# Patient Record
Sex: Female | Born: 1995 | Race: White | Hispanic: No | Marital: Single | Smoking: Never smoker
Health system: Southern US, Community
[De-identification: ages and names within clinical notes are randomized; demographics above are authoritative.]

## PROBLEM LIST (undated history)

## (undated) DIAGNOSIS — Z789 Other specified health status: Secondary | ICD-10-CM

## (undated) HISTORY — PX: MYRINGOTOMY WITH TUBE PLACEMENT: SHX5663

---
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
## 2017-01-12 ENCOUNTER — Ambulatory Visit: Payer: BLUE CROSS/BLUE SHIELD | Admitting: Family Medicine

## 2017-01-12 ENCOUNTER — Ambulatory Visit (INDEPENDENT_AMBULATORY_CARE_PROVIDER_SITE_OTHER): Payer: BLUE CROSS/BLUE SHIELD | Admitting: Family Medicine

## 2017-01-12 ENCOUNTER — Encounter: Payer: Self-pay | Admitting: Family Medicine

## 2017-01-12 VITALS — BP 124/62 | HR 85 | Temp 98.4°F | Ht 61.5 in | Wt 118.0 lb

## 2017-01-12 DIAGNOSIS — J0100 Acute maxillary sinusitis, unspecified: Secondary | ICD-10-CM

## 2017-01-12 DIAGNOSIS — N3001 Acute cystitis with hematuria: Secondary | ICD-10-CM

## 2017-01-12 DIAGNOSIS — R3915 Urgency of urination: Secondary | ICD-10-CM

## 2017-01-12 DIAGNOSIS — R591 Generalized enlarged lymph nodes: Secondary | ICD-10-CM

## 2017-01-12 DIAGNOSIS — R599 Enlarged lymph nodes, unspecified: Secondary | ICD-10-CM | POA: Insufficient documentation

## 2017-01-12 DIAGNOSIS — N390 Urinary tract infection, site not specified: Secondary | ICD-10-CM | POA: Insufficient documentation

## 2017-01-12 DIAGNOSIS — J019 Acute sinusitis, unspecified: Secondary | ICD-10-CM | POA: Insufficient documentation

## 2017-01-12 LAB — POC URINALSYSI DIPSTICK (AUTOMATED)
Blood, UA: 80
Spec Grav, UA: >=1.03 — AB (ref 1.010–1.025)
pH, UA: 5.5 (ref 5.0–8.0)

## 2017-01-12 MED ORDER — CEPHALEXIN 500 MG PO CAPS: 500.0000 mg | ORAL_CAPSULE | Freq: Three times a day (TID) | ORAL | 0 refills | Status: DC

## 2017-01-12 MED ORDER — BENZONATATE 200 MG PO CAPS: 200.0000 mg | ORAL_CAPSULE | Freq: Three times a day (TID) | ORAL | 1 refills | Status: DC | PRN

## 2017-01-12 NOTE — Progress Notes (Signed)
Subjective:    Patient ID: Marcia Thomas, female    DOB: 1996/03/19, 21 y.o.   MRN: [id number]  HPI Here for uri and urinary c/o   Just moved back from college-exp to lots of dust  Sick with uri symptoms for over 3 weeks Also had strep throat 2 wk ago  Her lymph nodes still feel swollen Allergies (pollen  Kicked in)  Can't stop coughing -did not sleep last night   Cough- is occ prod- clear/ ? Unsure if color  A lot of nasal d/c - that was green and lighter now  No fever  Facial pain - maxillary sinus area  otc: sudafed/ zyrtec/ occ mucinex  Ibuprofen sometimes   Urinary symptoms:  Last night she felt like she had to urinate constantly  (of note- had yeast infection) Saw some blood in urine  She took some uristat  Not on menses  LMP - April 15th -- she is sexually active and uses condoms every time   Some burning to urinate  No back pain  Has had a uti before-similar symptoms    Results for orders placed or performed in visit on 01/12/17  POCT Urinalysis Dipstick (Automated)  Result Value Ref Range   Color, UA Orange    Clarity, UA Hazy    Glucose, UA     Bilirubin, UA     Ketones, UA     Spec Grav, UA >=1.030 (A) 1.010 - 1.025   Blood, UA 80 Ery/uL    pH, UA 5.5 5.0 - 8.0   Protein, UA     Urobilinogen, UA  0.2 or 1.0 E.U./dL   Nitrite, UA     Leukocytes, UA Large (3+) (A) Negative   of note- she has uristat in her system as well   Patient Active Problem List   Diagnosis Date Noted  . Acute sinusitis 01/12/2017  . UTI (urinary tract infection) 01/12/2017  . Adenopathy 01/12/2017  . Sports physical 04/13/2015  . Well child check 04/13/2015   No past medical history on file. No past surgical history on file. Social History  Substance Use Topics  . Smoking status: Never Smoker  . Smokeless tobacco: Never Used  . Alcohol use Yes     Comment: occ/socially   No family history on file. No Known Allergies No current outpatient prescriptions on file  prior to visit.   No current facility-administered medications on file prior to visit.      Review of Systems  Constitutional: Positive for appetite change and fatigue. Negative for activity change and fever.  HENT: Positive for congestion, postnasal drip, rhinorrhea, sinus pressure, sneezing and sore throat. Negative for ear pain.   Eyes: Negative for pain, discharge, itching and visual disturbance.  Respiratory: Positive for cough. Negative for shortness of breath, wheezing and stridor.   Cardiovascular: Negative for chest pain and leg swelling.  Gastrointestinal: Negative for abdominal distention, abdominal pain, constipation, diarrhea, nausea and vomiting.  Endocrine: Negative for cold intolerance and polydipsia.  Genitourinary: Positive for dysuria and frequency. Negative for difficulty urinating, flank pain, hematuria, urgency and vaginal discharge.  Musculoskeletal: Negative for arthralgias and myalgias.  Skin: Negative for rash.  Allergic/Immunologic: Negative for immunocompromised state.  Neurological: Positive for headaches. Negative for dizziness, weakness and light-headedness.  Hematological: Negative for adenopathy.  Psychiatric/Behavioral: Negative for confusion and dysphoric mood.       Objective:   Physical Exam  Constitutional: She appears well-developed and well-nourished. No distress.  HENT:  Head: Normocephalic and  atraumatic.  Right Ear: External ear normal.  Left Ear: External ear normal.  Mouth/Throat: Oropharynx is clear and moist.  Nares are injected and congested  bilat maxillary and frontal sinus tenderness  Clear rhinorrhea and post nasal drip   Eyes: Conjunctivae and EOM are normal. Pupils are equal, round, and reactive to light. Right eye exhibits no discharge. Left eye exhibits no discharge.  Neck: Normal range of motion. Neck supple.  L submandibular node is palpable and tender moblie- 1 cm approx  No other adenopathy noted   Cardiovascular:  Normal rate, regular rhythm and normal heart sounds.   Pulmonary/Chest: Effort normal and breath sounds normal. No respiratory distress. She has no wheezes. She has no rales. She exhibits no tenderness.  Abdominal: Soft. Bowel sounds are normal. She exhibits no distension. There is tenderness. There is no rebound.  No cva tenderness  Mild suprapubic tenderness  Musculoskeletal: She exhibits no edema.  Lymphadenopathy:    She has no cervical adenopathy.  Neurological: She is alert.  Skin: Skin is warm and dry. No rash noted.  Psychiatric: She has a normal mood and affect.          Assessment & Plan:   Problem List Items Addressed This Visit      Respiratory   Acute sinusitis    s/p allergies and uri Cover with keflex (also for uti ) Disc symptomatic care - see instructions on AVS  Update if not starting to improve in a week or if worsening    Tessalon given for cough mucinex dm prn also        Relevant Medications   cephALEXin (KEFLEX) 500 MG capsule   benzonatate (TESSALON) 200 MG capsule     Immune and Lymphatic   Adenopathy    L submandibular node is tender This may be due to several upper resp infx in a row but warrants a re check  We will schedule with pcp 2 wk        Genitourinary   UTI (urinary tract infection)    Cover with keflex Literature given -re: prevention Fluids ucx sent  Update if no imp in several days or if worse       Relevant Medications   cephALEXin (KEFLEX) 500 MG capsule   Other Relevant Orders   Urine culture    Other Visit Diagnoses    Urinary urgency    -  Primary   Relevant Orders   POCT Urinalysis Dipstick (Automated) (Completed)

## 2017-01-12 NOTE — Progress Notes (Signed)
Pre visit review using our clinic review tool, if applicable. No additional management support is needed unless otherwise documented below in the visit note. 

## 2017-01-12 NOTE — Patient Instructions (Addendum)
Drink lots of water Get extra rest if you can  Always wipe front to back to prevent utis  Take the keflex as directed for both sinus infection and uti  We will let you know when urine culture results return   mucinex is ok  Tessalon for cough as well   Update if not starting to improve in a week or if worsening   If uti symptom worsen let Marcia Thomas know right away   Follow up with Dr Dayton Martes in about 2 weeks to check your lymph node and also talk about oral contraceptive       Urinary Tract Infection, Adult A urinary tract infection (UTI) is an infection of any part of the urinary tract, which includes the kidneys, ureters, bladder, and urethra. These organs make, store, and get rid of urine in the body. UTI can be a bladder infection (cystitis) or kidney infection (pyelonephritis). What are the causes? This infection may be caused by fungi, viruses, or bacteria. Bacteria are the most common cause of UTIs. This condition can also be caused by repeated incomplete emptying of the bladder during urination. What increases the risk? This condition is more likely to develop if:  You ignore your need to urinate or hold urine for long periods of time.  You do not empty your bladder completely during urination.  You wipe back to front after urinating or having a bowel movement, if you are female.  You are uncircumcised, if you are female.  You are constipated.  You have a urinary catheter that stays in place (indwelling).  You have a weak defense (immune) system.  You have a medical condition that affects your bowels, kidneys, or bladder.  You have diabetes.  You take antibiotic medicines frequently or for long periods of time, and the antibiotics no longer work well against certain types of infections (antibiotic resistance).  You take medicines that irritate your urinary tract.  You are exposed to chemicals that irritate your urinary tract.  You are female. What are the signs or  symptoms? Symptoms of this condition include:  Fever.  Frequent urination or passing small amounts of urine frequently.  Needing to urinate urgently.  Pain or burning with urination.  Urine that smells bad or unusual.  Cloudy urine.  Pain in the lower abdomen or back.  Trouble urinating.  Blood in the urine.  Vomiting or being less hungry than normal.  Diarrhea or abdominal pain.  Vaginal discharge, if you are female. How is this diagnosed? This condition is diagnosed with a medical history and physical exam. You will also need to provide a urine sample to test your urine. Other tests may be done, including:  Blood tests.  Sexually transmitted disease (STD) testing. If you have had more than one UTI, a cystoscopy or imaging studies may be done to determine the cause of the infections. How is this treated? Treatment for this condition often includes a combination of two or more of the following:  Antibiotic medicine.  Other medicines to treat less common causes of UTI.  Over-the-counter medicines to treat pain.  Drinking enough water to stay hydrated. Follow these instructions at home:  Take over-the-counter and prescription medicines only as told by your health care provider.  If you were prescribed an antibiotic, take it as told by your health care provider. Do not stop taking the antibiotic even if you start to feel better.  Avoid alcohol, caffeine, tea, and carbonated beverages. They can irritate your bladder.  Drink  enough fluid to keep your urine clear or pale yellow.  Keep all follow-up visits as told by your health care provider. This is important.  Make sure to:  Empty your bladder often and completely. Do not hold urine for long periods of time.  Empty your bladder before and after sex.  Wipe from front to back after a bowel movement if you are female. Use each tissue one time when you wipe. Contact a health care provider if:  You have back  pain.  You have a fever.  You feel nauseous or vomit.  Your symptoms do not get better after 3 days.  Your symptoms go away and then return. Get help right away if:  You have severe back pain or lower abdominal pain.  You are vomiting and cannot keep down any medicines or water. This information is not intended to replace advice given to you by your health care provider. Make sure you discuss any questions you have with your health care provider. Document Released: 06/07/2005 Document Revised: 02/09/2016 Document Reviewed: 07/19/2015 Elsevier Interactive Patient Education  2017 [employer]

## 2017-01-12 NOTE — Assessment & Plan Note (Signed)
L submandibular node is tender This may be due to several upper resp infx in a row but warrants a re check  We will schedule with pcp 2 wk

## 2017-01-12 NOTE — Assessment & Plan Note (Signed)
Cover with keflex Literature given -re: prevention Fluids ucx sent  Update if no imp in several days or if worse

## 2017-01-12 NOTE — Assessment & Plan Note (Signed)
s/p allergies and uri Cover with keflex (also for uti ) Disc symptomatic care - see instructions on AVS  Update if not starting to improve in a week or if worsening    Tessalon given for cough mucinex dm prn also

## 2017-01-15 LAB — URINE CULTURE

## 2017-01-29 ENCOUNTER — Encounter: Payer: Self-pay | Admitting: Family Medicine

## 2017-01-29 ENCOUNTER — Ambulatory Visit (INDEPENDENT_AMBULATORY_CARE_PROVIDER_SITE_OTHER): Payer: BLUE CROSS/BLUE SHIELD | Admitting: Family Medicine

## 2017-01-29 VITALS — BP 100/62 | HR 76 | Temp 98.0°F | Ht 61.5 in | Wt 118.0 lb

## 2017-01-29 DIAGNOSIS — R591 Generalized enlarged lymph nodes: Secondary | ICD-10-CM

## 2017-01-29 DIAGNOSIS — J0100 Acute maxillary sinusitis, unspecified: Secondary | ICD-10-CM

## 2017-01-29 DIAGNOSIS — N3001 Acute cystitis with hematuria: Secondary | ICD-10-CM

## 2017-01-29 DIAGNOSIS — R599 Enlarged lymph nodes, unspecified: Secondary | ICD-10-CM

## 2017-01-29 LAB — CBC WITH DIFFERENTIAL/PLATELET
Basophils Absolute: 0 10*3/uL (ref 0.0–0.1)
Basophils Relative: 0.3 % (ref 0.0–3.0)
Eosinophils Absolute: 0.2 10*3/uL (ref 0.0–0.7)
Eosinophils Relative: 2.2 % (ref 0.0–5.0)
HCT: 41 % (ref 36.0–49.0)
Hemoglobin: 13.6 g/dL (ref 12.0–16.0)
Lymphocytes Relative: 30.2 % (ref 24.0–48.0)
Lymphs Abs: 2.8 10*3/uL (ref 0.7–4.0)
MCHC: 33.2 g/dL (ref 31.0–37.0)
MCV: 87.5 fl (ref 78.0–98.0)
Monocytes Absolute: 1 10*3/uL (ref 0.1–1.0)
Monocytes Relative: 10.4 % (ref 3.0–12.0)
Neutro Abs: 5.3 10*3/uL (ref 1.4–7.7)
Neutrophils Relative %: 56.9 % (ref 43.0–71.0)
Platelets: 406 10*3/uL (ref 150.0–575.0)
RBC: 4.69 Mil/uL (ref 3.80–5.70)
RDW: 13.2 % (ref 11.4–15.5)
WBC: 9.3 10*3/uL (ref 4.5–13.5)

## 2017-01-29 NOTE — Assessment & Plan Note (Signed)
Persistent.  Likely remains reactive given multiple upper resp/throat infections recently but will check CBC and US for further evaluation today. The patient indicates understanding of these issues and agrees with the plan.

## 2017-01-29 NOTE — Assessment & Plan Note (Signed)
Symptoms resolving s/p Keflex. Call or return to clinic prn if these symptoms worsen or fail to improve as anticipated.

## 2017-01-29 NOTE — Progress Notes (Signed)
Subjective:   Patient ID: Marcia Thomas, female    DOB: 04/17/1996, 21 y.o.   MRN: [id number]  Marcia Thomas is a pleasant 21 y.o. year old female who presents to clinic today with Follow-up  on 01/29/2017  HPI:  Saw my partner, Dr. Milinda Antisower on 01/12/17 for several issues. Note reviewed.  She did advise her to follow up with me here today.  Initial complaint was URI symptoms for over 3 weeks with a productive cough and facial pain. Was also having urinary symptoms of increased urinary frequency, dysuria and hematuria. UA pos for LE and RBCs Some cervical lymphadenopathy was noted  Dr. Milinda Antisower placed her on Keflex to cover upper respiratory and urinary pathogens.  Also given tessalon for cough.  Urine cx grew e coli- pan sensitive.  Advised to see me for follow up left enlarged and tender left cervical lymph node.  Marcia Thomas reports that it is still swollen. Non tender.  UTI symptoms have resolved completely and URI symptoms are resolving.  She still has a slight cough.  No current outpatient prescriptions on file prior to visit.   No current facility-administered medications on file prior to visit.     No Known Allergies  No past medical history on file.  No past surgical history on file.  No family history on file.  Social History   Social History  . Marital status: Single    Spouse name: N/A  . Number of children: N/A  . Years of education: N/A   Occupational History  . Not on file.   Social History Main Topics  . Smoking status: Never Smoker  . Smokeless tobacco: Never Used  . Alcohol use Yes     Comment: occ/socially  . Drug use: No  . Sexual activity: Not on file   Other Topics Concern  . Not on file   Social History Narrative   Going into senior year.   Wants to go to [employer]- wants to be a dermatologist.      Doing well in school.   Tennis and softball.   The PMH, PSH, Social History, Family History, Medications, and allergies [facility]  [facility], and have been updated if relevant.   Review of Systems  Constitutional: Negative.   HENT: Negative.   Respiratory: Negative.   Cardiovascular: Negative.   Gastrointestinal: Negative.   Genitourinary: Negative.   Musculoskeletal: Negative.   Allergic/Immunologic: Negative.   Neurological: Negative.   Hematological: Positive for adenopathy.  Psychiatric/Behavioral: Negative.   All other systems reviewed and are negative.      Objective:    BP 100/62   Pulse 76   Temp 98 F (36.7 C)   Ht 5' 1.5" (1.562 m)   Wt 118 lb (53.5 kg)   SpO2 98%   BMI 21.93 kg/m    Physical Exam  Constitutional: She is oriented to person, place, and time. She appears well-developed and well-nourished. No distress.  HENT:  Head: Normocephalic and atraumatic.  Eyes: Conjunctivae are normal.  Cardiovascular: Normal rate and regular rhythm.   Pulmonary/Chest: Effort normal and breath sounds normal.  Musculoskeletal: Normal range of motion.  Lymphadenopathy:       Head (left side): No submandibular adenopathy present.    She has cervical adenopathy.  Neurological: She is alert and oriented to person, place, and time. No cranial nerve deficit.  Skin: Skin is warm and dry. She is not diaphoretic.  Psychiatric: She has a normal mood and affect. Her behavior  is normal. Judgment and thought content normal.  Nursing note and vitals reviewed.         Assessment & Plan:   Acute cystitis with hematuria  Acute maxillary sinusitis, recurrence not specified  Adenopathy No Follow-up on file.

## 2017-01-29 NOTE — Patient Instructions (Signed)
Great to see you.  I will call you with your lab results.  Please stop by to see Marion on your way out. 

## 2017-01-29 NOTE — Assessment & Plan Note (Signed)
Urine cx grew pansensitive e coli which should have been sensitive to kelfex. Symptoms have resolved.  No further work up or tx warranted at this time.

## 2017-01-31 ENCOUNTER — Ambulatory Visit
Admission: RE | Admit: 2017-01-31 | Discharge: 2017-01-31 | Disposition: A | Discharge status: Home / Self Care | Payer: BLUE CROSS/BLUE SHIELD | Source: Ambulatory Visit | Attending: Family Medicine | Admitting: Family Medicine

## 2017-01-31 DIAGNOSIS — R591 Generalized enlarged lymph nodes: Secondary | ICD-10-CM | POA: Insufficient documentation

## 2017-01-31 DIAGNOSIS — R599 Enlarged lymph nodes, unspecified: Secondary | ICD-10-CM

## 2017-02-02 ENCOUNTER — Other Ambulatory Visit: Payer: Self-pay | Admitting: Family Medicine

## 2017-02-02 DIAGNOSIS — R590 Localized enlarged lymph nodes: Secondary | ICD-10-CM

## 2017-03-07 ENCOUNTER — Ambulatory Visit (INDEPENDENT_AMBULATORY_CARE_PROVIDER_SITE_OTHER): Payer: BLUE CROSS/BLUE SHIELD | Admitting: Family Medicine

## 2017-03-07 ENCOUNTER — Encounter: Payer: Self-pay | Admitting: Family Medicine

## 2017-03-07 VITALS — BP 100/62 | HR 75 | Temp 98.1°F | Wt 115.0 lb

## 2017-03-07 DIAGNOSIS — R319 Hematuria, unspecified: Secondary | ICD-10-CM | POA: Diagnosis not present

## 2017-03-07 DIAGNOSIS — R300 Dysuria: Secondary | ICD-10-CM

## 2017-03-07 LAB — POC URINALSYSI DIPSTICK (AUTOMATED)
Bilirubin, UA: NEGATIVE
Blood, UA: NEGATIVE
Glucose, UA: NEGATIVE
Ketones, UA: NEGATIVE
Leukocytes, UA: NEGATIVE
Nitrite, UA: NEGATIVE
Protein, UA: NEGATIVE
Spec Grav, UA: >=1.03 — AB (ref 1.010–1.025)
Urobilinogen, UA: 0.2 E.U./dL
pH, UA: 6 (ref 5.0–8.0)

## 2017-03-07 NOTE — Progress Notes (Signed)
Pre visit review using our clinic review tool, if applicable. No additional management support is needed unless otherwise documented below in the visit note. 

## 2017-03-07 NOTE — Progress Notes (Signed)
SUBJECTIVE: Marcia Thomas is a 21 y.o. female who complains of urinary frequency, urgency, hematuria and dysuria x 2 days, without flank pain, fever, chills, or abnormal vaginal discharge.  Was treated for UTI with keflex by Dr. Milinda Antisower last month.  Urine cx reviewed from 01/12/17- >100,000 E coli, pan sensitive.  Symptoms resolved after she took kelfex.  Restarted again 2 days.  No current outpatient prescriptions on file prior to visit.   No current facility-administered medications on file prior to visit.     No Known Allergies  No past medical history on file.  No past surgical history on file.  No family history on file.  Social History   Social History  . Marital status: Single    Spouse name: N/A  . Number of children: N/A  . Years of education: N/A   Occupational History  . Not on file.   Social History Main Topics  . Smoking status: Never Smoker  . Smokeless tobacco: Never Used  . Alcohol use Yes     Comment: occ/socially  . Drug use: No  . Sexual activity: Not on file   Other Topics Concern  . Not on file   Social History Narrative   Going into senior year.   Wants to go to [employer]- wants to be a dermatologist.      Doing well in school.   Tennis and softball.   The PMH, PSH, Social History, Family History, Medications, and allergies have [facility], and have been updated if relevant.  OBJECTIVE:  BP 100/62   Pulse 75   Temp 98.1 F (36.7 C)   Wt 115 lb (52.2 kg)   SpO2 97%   BMI 21.38 kg/m   Appears well, in no apparent distress.  Vital signs are normal. The abdomen is soft without tenderness, guarding, mass, rebound or organomegaly. No CVA tenderness or inguinal adenopathy noted. Urine dipstick shows negative for all components.  Micro exam: not done.   ASSESSMENT: UA neg- UTI resolved and symptoms improving.    PLAN: Reassurance provided. Push fluids. Call or return to clinic prn if these symptoms worsen or fail to improve as  anticipated. The patient indicates understanding of these issues and agrees with the plan.

## 2017-03-11 ENCOUNTER — Ambulatory Visit
Admission: EM | Admit: 2017-03-11 | Discharge: 2017-03-11 | Disposition: A | Discharge status: Home / Self Care | Payer: BLUE CROSS/BLUE SHIELD | Attending: Family Medicine | Admitting: Family Medicine

## 2017-03-11 DIAGNOSIS — R509 Fever, unspecified: Secondary | ICD-10-CM

## 2017-03-11 DIAGNOSIS — J020 Streptococcal pharyngitis: Secondary | ICD-10-CM

## 2017-03-11 LAB — RAPID STREP SCREEN (MED CTR MEBANE ONLY): Streptococcus, Group A Screen (Direct): POSITIVE — AB

## 2017-03-11 MED ORDER — IBUPROFEN 400 MG PO TABS: 400.0000 mg | ORAL_TABLET | Freq: Once | ORAL | Status: DC

## 2017-03-11 MED ORDER — AMOXICILLIN 875 MG PO TABS: 875.0000 mg | ORAL_TABLET | Freq: Two times a day (BID) | ORAL | 0 refills | Status: AC

## 2017-03-11 MED ORDER — ACETAMINOPHEN 500 MG PO TABS
1000.0000 mg | ORAL_TABLET | Freq: Once | ORAL | Status: AC
  Administered 2017-03-11: 1000 mg via ORAL

## 2017-03-11 MED ORDER — IBUPROFEN 400 MG PO TABS
400.0000 mg | ORAL_TABLET | Freq: Once | ORAL | Status: AC
  Administered 2017-03-11: 400 mg via ORAL

## 2017-03-11 NOTE — ED Provider Notes (Signed)
CSN: [id number]     Arrival date & time 03/11/17  1520 History   None    Chief Complaint  Patient presents with  . Sore Throat   (Consider location/radiation/quality/duration/timing/severity/associated sxs/prior Treatment) HPI  21 year old female presents to the clinic for evaluation of sore throat and fever. Symptoms been present for 24 hours. She is tolerating by mouth well. She took 400 mg of ibuprofen 3 hours ago. She's not had any medications. Tolerating fluids currently. She denies any cough congestion or runny nose. She is having mild headache with body aches. No chest pain or shortness of breath. No skin rashes.  History reviewed. No pertinent past medical history. History reviewed. No pertinent surgical history. History reviewed. No pertinent family history. Social History  Substance Use Topics  . Smoking status: Never Smoker  . Smokeless tobacco: Never Used  . Alcohol use Yes     Comment: occ/socially   OB History    No data available     Review of Systems  Constitutional: Positive for fever.  HENT: Positive for sore throat. Negative for trouble swallowing and voice change.   Respiratory: Negative for shortness of breath.   Cardiovascular: Negative for chest pain.  Skin: Positive for rash.    Allergies  Patient has no known allergies.  Home Medications   Prior to Admission medications   Medication Sig Start Date End Date Taking? Authorizing Provider  amoxicillin (AMOXIL) 875 MG tablet Take 1 tablet (875 mg total) by mouth 2 (two) times daily. X 10 days 03/11/17   Evon Slack, PA-C   Meds Ordered and Administered this Visit   Medications  acetaminophen (TYLENOL) tablet 1,000 mg (1,000 mg Oral Given 03/11/17 1537)  ibuprofen (ADVIL,MOTRIN) tablet 400 mg (400 mg Oral Given 03/11/17 1542)    BP (!) 98/56 (BP Location: Left Arm)   Pulse (!) 128   Temp (!) 103 F (39.4 C) (Oral)   Resp 18   Wt 115 lb (52.2 kg)   LMP 02/22/2017   SpO2 99%   BMI 21.38 kg/m   No data found.   Physical Exam  Constitutional: She is oriented to person, place, and time. She appears well-developed and well-nourished. No distress.  HENT:  Head: Normocephalic and atraumatic.  Mouth/Throat: Oropharyngeal exudate present.  Positive pharyngeal exudates bilaterally with no uvular shift or signs of peritonsillar abscess.  Eyes: EOM are normal. Pupils are equal, round, and reactive to light. Right eye exhibits no discharge. Left eye exhibits no discharge.  Neck: Normal range of motion. Neck supple.  Cardiovascular: Normal rate, regular rhythm and intact distal pulses.   Pulmonary/Chest: Effort normal and breath sounds normal. No respiratory distress. She exhibits no tenderness.  Abdominal: Soft. She exhibits no distension. There is no tenderness.  Musculoskeletal: Normal range of motion. She exhibits no edema.  Lymphadenopathy:    She has cervical adenopathy (positive).  Neurological: She is alert and oriented to person, place, and time. She has normal reflexes.  Skin: Skin is warm and dry.  Psychiatric: She has a normal mood and affect. Her behavior is normal. Thought content normal.    Urgent Care Course     Procedures (including critical care time)  Labs Review Labs Reviewed  RAPID STREP SCREEN (NOT AT [facility]) - Abnormal; Notable for the following:       Result Value   Streptococcus, Group A Screen (Direct) POSITIVE (*)    All other components within normal limits    Imaging Review No results found.   Visual  Acuity Review  Right Eye Distance:   Left Eye Distance:   Bilateral Distance:    Right Eye Near:   Left Eye Near:    Bilateral Near:         MDM   1. Pharyngitis due to Streptococcus species   2. Fever, unspecified    21 year old female with strep pharyngitis. We'll treat with amoxicillin. She is tolerating by mouth well. She'll alternate Tylenol and ibuprofen as needed for fevers. She is educated on signs and symptoms return to clinic  for.    Evon SlackGaines, Seda Kronberg C, [place]PA-C 03/11/17 1553

## 2017-03-11 NOTE — Discharge Instructions (Signed)
2000 and Tylenol and ibuprofen as needed for fevers. Take medications as prescribed. Return to the clinic for any occult swelling worsening symptoms urgent changes in health.

## 2017-03-11 NOTE — ED Triage Notes (Signed)
Patient complains of sore throat. Patient states that symptoms started last night with fever and swollen tonsils.

## 2017-03-23 DIAGNOSIS — J3503 Chronic tonsillitis and adenoiditis: Secondary | ICD-10-CM | POA: Diagnosis present

## 2017-03-23 DIAGNOSIS — J3501 Chronic tonsillitis: Secondary | ICD-10-CM | POA: Diagnosis not present

## 2017-03-30 ENCOUNTER — Ambulatory Visit: Payer: BLUE CROSS/BLUE SHIELD | Admitting: Anesthesiology

## 2017-03-30 ENCOUNTER — Ambulatory Visit
Admission: RE | Admit: 2017-03-30 | Discharge: 2017-03-30 | Disposition: A | Discharge status: Home / Self Care | Payer: BLUE CROSS/BLUE SHIELD | Source: Ambulatory Visit | Attending: Unknown Physician Specialty | Admitting: Unknown Physician Specialty

## 2017-03-30 ENCOUNTER — Encounter
Admission: RE | Disposition: A | Discharge status: Home / Self Care | Payer: Self-pay | Source: Ambulatory Visit | Attending: Unknown Physician Specialty

## 2017-03-30 DIAGNOSIS — J3501 Chronic tonsillitis: Secondary | ICD-10-CM | POA: Diagnosis not present

## 2017-03-30 DIAGNOSIS — J3503 Chronic tonsillitis and adenoiditis: Secondary | ICD-10-CM | POA: Insufficient documentation

## 2017-03-30 HISTORY — DX: Other specified health status: Z78.9

## 2017-03-30 HISTORY — PX: TONSILLECTOMY AND ADENOIDECTOMY: SHX28

## 2017-03-30 SURGERY — TONSILLECTOMY AND ADENOIDECTOMY
Anesthesia: General | Wound class: Clean Contaminated

## 2017-03-30 MED ORDER — FENTANYL CITRATE (PF) 100 MCG/2ML IJ SOLN
INTRAMUSCULAR | Status: DC | PRN
  Administered 2017-03-30: 100 ug via INTRAVENOUS

## 2017-03-30 MED ORDER — FENTANYL CITRATE (PF) 100 MCG/2ML IJ SOLN: 25.0000 ug | INTRAMUSCULAR | Status: DC | PRN

## 2017-03-30 MED ORDER — SCOPOLAMINE 1 MG/3DAYS TD PT72
1.0000 | MEDICATED_PATCH | Freq: Once | TRANSDERMAL | Status: DC
  Administered 2017-03-30: 1.5 mg via TRANSDERMAL

## 2017-03-30 MED ORDER — PROPOFOL 10 MG/ML IV BOLUS
INTRAVENOUS | Status: DC | PRN
  Administered 2017-03-30: 200 mg via INTRAVENOUS

## 2017-03-30 MED ORDER — HYDROCODONE-ACETAMINOPHEN 7.5-325 MG/15ML PO SOLN: 15.0000 mL | Freq: Four times a day (QID) | ORAL | 0 refills | Status: AC | PRN

## 2017-03-30 MED ORDER — DEXAMETHASONE SODIUM PHOSPHATE 4 MG/ML IJ SOLN
INTRAMUSCULAR | Status: DC | PRN
  Administered 2017-03-30: 8 mg via INTRAVENOUS

## 2017-03-30 MED ORDER — MIDAZOLAM HCL 5 MG/5ML IJ SOLN
INTRAMUSCULAR | Status: DC | PRN
  Administered 2017-03-30: 2 mg via INTRAVENOUS

## 2017-03-30 MED ORDER — ONDANSETRON HCL 4 MG/2ML IJ SOLN
INTRAMUSCULAR | Status: DC | PRN
  Administered 2017-03-30: 4 mg via INTRAVENOUS

## 2017-03-30 MED ORDER — GLYCOPYRROLATE 0.2 MG/ML IJ SOLN
INTRAMUSCULAR | Status: DC | PRN
  Administered 2017-03-30: 0.2 mg via INTRAVENOUS

## 2017-03-30 MED ORDER — OXYCODONE HCL 5 MG/5ML PO SOLN
5.0000 mg | Freq: Once | ORAL | Status: AC | PRN
  Administered 2017-03-30: 5 mg via ORAL

## 2017-03-30 MED ORDER — ROPIVACAINE HCL 5 MG/ML IJ SOLN
INTRAMUSCULAR | Status: DC | PRN
  Administered 2017-03-30: 9 mL

## 2017-03-30 MED ORDER — ONDANSETRON HCL 4 MG/2ML IJ SOLN: 4.0000 mg | Freq: Once | INTRAMUSCULAR | Status: DC | PRN

## 2017-03-30 MED ORDER — LIDOCAINE HCL (CARDIAC) 20 MG/ML IV SOLN
INTRAVENOUS | Status: DC | PRN
  Administered 2017-03-30: 50 mg via INTRAVENOUS

## 2017-03-30 MED ORDER — LACTATED RINGERS IV SOLN
INTRAVENOUS | Status: DC
  Administered 2017-03-30: 10:00:00 via INTRAVENOUS

## 2017-03-30 MED ORDER — OXYCODONE HCL 5 MG PO TABS: 5.0000 mg | ORAL_TABLET | Freq: Once | ORAL | Status: AC | PRN

## 2017-03-30 MED ORDER — ACETAMINOPHEN 10 MG/ML IV SOLN
1000.0000 mg | Freq: Once | INTRAVENOUS | Status: AC
  Administered 2017-03-30: 1000 mg via INTRAVENOUS

## 2017-03-30 SURGICAL SUPPLY — 22 items
CANISTER SUCT 1200ML W/VALVE (MISCELLANEOUS) ×2 IMPLANT
CATH RUBBER RED 8F (CATHETERS) ×2 IMPLANT
COAG SUCT 10F 3.5MM HAND CTRL (MISCELLANEOUS) ×2 IMPLANT
DRAPE HEAD BAR (DRAPES) ×2 IMPLANT
ELECT CAUTERY BLADE TIP 2.5 (TIP) ×2
ELECTRODE CAUTERY BLDE TIP 2.5 (TIP) ×1 IMPLANT
GLOVE BIO SURGEON STRL SZ7.5 (GLOVE) ×2 IMPLANT
HANDLE SUCTION POOLE (INSTRUMENTS) ×1 IMPLANT
KIT ROOM TURNOVER OR (KITS) ×2 IMPLANT
NDL HYPO 25GX1X1/2 BEV (NEEDLE) ×1 IMPLANT
NEEDLE HYPO 25GX1X1/2 BEV (NEEDLE) ×2 IMPLANT
NS IRRIG 500ML POUR BTL (IV SOLUTION) ×2 IMPLANT
PACK TONSIL/ADENOIDS (PACKS) ×2 IMPLANT
PAD GROUND ADULT SPLIT (MISCELLANEOUS) ×2 IMPLANT
PENCIL ELECTRO HAND CTR (MISCELLANEOUS) ×2 IMPLANT
SOL ANTI-FOG 6CC FOG-OUT (MISCELLANEOUS) ×1 IMPLANT
SOL FOG-OUT ANTI-FOG 6CC (MISCELLANEOUS) ×1
SPONGE TONSIL 1 RF SGL (DISPOSABLE) ×2 IMPLANT
STRAP BODY AND KNEE 60X3 (MISCELLANEOUS) ×2 IMPLANT
SUCTION POOLE HANDLE (INSTRUMENTS) ×2
SYR 5ML LL (SYRINGE) ×2 IMPLANT
SYRINGE 10CC LL (SYRINGE) IMPLANT

## 2017-03-30 NOTE — Anesthesia Postprocedure Evaluation (Signed)
Anesthesia Post Note  Patient: Allie BossierJillian F Mills  Procedure(s) Performed: Procedure(s) (LRB): TONSILLECTOMY AND ADENOIDECTOMY (N/A)  Patient location during evaluation: PACU Anesthesia Type: General Level of consciousness: awake and alert and oriented Pain management: satisfactory to patient Vital Signs Assessment: post-procedure vital signs reviewed and stable Respiratory status: spontaneous breathing, nonlabored ventilation and respiratory function stable Cardiovascular status: blood pressure returned to baseline and stable Postop Assessment: Adequate PO intake and No signs of nausea or vomiting Anesthetic complications: no    Cherly BeachStella, Elizabethann Lackey J

## 2017-03-30 NOTE — Anesthesia Procedure Notes (Signed)
Procedure Name: Intubation Date/Time: 03/30/2017 10:55 AM Performed by: Londell Moh Pre-anesthesia Checklist: Patient identified, Emergency Drugs available, Suction available, Patient being monitored and Timeout performed Patient Re-evaluated:Patient Re-evaluated prior to induction Oxygen Delivery Method: Circle system utilized Preoxygenation: Pre-oxygenation with 100% oxygen Induction Type: IV induction Ventilation: Mask ventilation without difficulty Laryngoscope Size: Mac and 3 Grade View: Grade I Tube type: Oral Rae Tube size: 7.0 mm Number of attempts: 1 Placement Confirmation: ETT inserted through vocal cords under direct vision,  positive ETCO2 and breath sounds checked- equal and bilateral Tube secured with: Tape Dental Injury: Teeth and Oropharynx as per pre-operative assessment

## 2017-03-30 NOTE — Op Note (Signed)
PREOPERATIVE DIAGNOSIS:  hypertrophy tonsil and adenoids chronic tonsilitis  POSTOPERATIVE DIAGNOSIS: Same  OPERATION:  Tonsillectomy and adenoidectomy.  SURGEON:  Davina Pokehapman T. Lacrisha Bielicki, MD  ANESTHESIA:  General endotracheal.  OPERATIVE FINDINGS:  Large tonsils and adenoids.  DESCRIPTION OF THE PROCEDURE:  Marcia BossierJillian F Lippe was identified in the holding area and taken to the operating room and placed in the supine position.  After general endotracheal anesthesia, the table was turned 45 degrees and the patient was draped in the usual fashion for a tonsillectomy.  A mouth gag was inserted into the oral cavity and examination of the oropharynx showed the uvula was non-bifid.  There was no evidence of submucous cleft to the palate.  There were large tonsils.  A red rubber catheter was placed through the nostril.  Examination of the nasopharynx showed large obstructing adenoids.  Under indirect vision with the mirror, an adenotome was placed in the nasopharynx.  The adenoids were curetted free.  Reinspection with a mirror showed excellent removal of the adenoid.  Nasopharyngeal packs were then placed.  The operation then turned to the tonsillectomy.  Beginning on the left-hand side a tenaculum was used to grasp the tonsil and the Bovie cautery was used to dissect it free from the fossa.  In a similar fashion, the right tonsil was removed.  Meticulous hemostasis was achieved using the Bovie cautery.  With both tonsils removed and no active bleeding, the nasopharyngeal packs were removed.  Suction cautery was then used to cauterize the nasopharyngeal bed to prevent bleeding.  The red rubber catheter was removed with no active bleeding.  0.5% plain ropivacaine was used to inject the anterior and posterior tonsillar pillars bilaterally.  A total of 9ml was used.  The patient tolerated the procedure well and was awakened in the operating room and taken to the recovery room in stable condition.   CULTURES:   None.  SPECIMENS:  Tonsils and adenoids.  ESTIMATED BLOOD LOSS:  Less than 20 ml.  Kasy Iannacone T  03/30/2017  11:15 AM

## 2017-03-30 NOTE — Anesthesia Preprocedure Evaluation (Signed)
Anesthesia Evaluation  Patient identified by MRN, date of birth, ID band Patient awake    Reviewed: Allergy & Precautions, H&P , NPO status , Patient's Chart, lab work & pertinent test results  Airway Mallampati: II  TM Distance: >3 FB Neck ROM: full    Dental no notable dental hx.    Pulmonary    Pulmonary exam normal breath sounds clear to auscultation       Cardiovascular Normal cardiovascular exam Rhythm:regular Rate:Normal     Neuro/Psych    GI/Hepatic   Endo/Other    Renal/GU      Musculoskeletal   Abdominal   Peds  Hematology   Anesthesia Other Findings   Reproductive/Obstetrics                             Anesthesia Physical Anesthesia Plan  ASA: I  Anesthesia Plan: General   Post-op Pain Management:    Induction:   PONV Risk Score and Plan: 3 and Ondansetron, Dexamethasone, Propofol, Midazolam and Scopolamine patch - Pre-op  Airway Management Planned:   Additional Equipment:   Intra-op Plan:   Post-operative Plan:   Informed Consent: I have reviewed the patients History and Physical, chart, labs and discussed the procedure including the risks, benefits and alternatives for the proposed anesthesia with the patient or authorized representative who has indicated his/her understanding and acceptance.     Plan Discussed with: CRNA  Anesthesia Plan Comments:         Anesthesia Quick Evaluation

## 2017-03-30 NOTE — Discharge Instructions (Signed)
T & A INSTRUCTION SHEET - [facility] °[facility] ° °CREIGHTON VAUGHT, MD °PAUL H. JUENGEL, MD  °[facility]. [facility] °CHAPMAN MCQUEEN, MD ° °[address] , [place] [address] TEL. [phone] °[address] [place] [address] [phone] ° °INFORMATION SHEET FOR A TONSILLECTOMY AND ADENDOIDECTOMY ° °About Your Tonsils and Adenoids ° The tonsils and adenoids are normal body tissues that are part of our immune system.  They normally help to protect us against diseases that may enter our mouth and nose.  However, sometimes the tonsils and/or adenoids become too large and obstruct our breathing, especially at night. °  ° If either of these things happen it helps to remove the tonsils and adenoids in order to become healthier. The operation to remove the tonsils and adenoids is called a tonsillectomy and adenoidectomy. ° °The Location of Your Tonsils and Adenoids ° The tonsils are located in the back of the throat on both side and sit in a cradle of muscles. The adenoids are located in the roof of the mouth, behind the nose, and closely associated with the opening of the Eustachian tube to the ear. ° °Surgery on Tonsils and Adenoids ° A tonsillectomy and adenoidectomy is a short operation which takes about thirty minutes.  This includes being put to sleep and being awakened.  Tonsillectomies and adenoidectomies are performed at [facility] and may require observation period in the recovery room prior to going home. ° °Following the Operation for a Tonsillectomy ° A cautery machine is used to control bleeding.  Bleeding from a tonsillectomy and adenoidectomy is minimal and postoperatively the risk of bleeding is approximately four percent, although this rarely life threatening. ° ° ° °After your tonsillectomy and adenoidectomy post-op care at home: ° °1. Our patients are able to go home the same day.  You may be given prescriptions for pain  medications and antibiotics, if indicated. °2. It is extremely important to remember that fluid intake is of utmost importance after a tonsillectomy.  The amount that you drink must be maintained in the postoperative period.  A good indication of whether a child is getting enough fluid is whether his/her urine output is constant.  As long as children are urinating or wetting their diaper every 6 - 8 hours this is usually enough fluid intake.   °3. Although rare, this is a risk of some bleeding in the first ten days after surgery.  This is usually occurs between day five and nine postoperatively.  This risk of bleeding is approximately four percent.  If you or your child should have any bleeding you should remain calm and notify our office or go directly to the Emergency Room at [facility] where they will contact us. Our doctors are available seven days a week for notification.  We recommend sitting up quietly in a chair, place an ice pack on the front of the neck and spitting out the blood gently until we are able to contact you.  Adults should gargle gently with ice water and this may help stop the bleeding.  If the bleeding does not stop after a short time, i.e. 10 to 15 minutes, or seems to be increasing again, please contact us or go to the hospital.   °4. It is common for the pain to be worse at 5 - 7 days postoperatively.  This occurs because the “scab” is peeling off and the mucous membrane (skin of   the throat) is growing back where the tonsils were.   °5. It is common for a low-grade fever, less than 102, during the first week after a tonsillectomy and adenoidectomy.  It is usually due to not drinking enough liquids, and we suggest your use liquid Tylenol or the pain medicine with Tylenol prescribed in order to keep your temperature below 102.  Please follow the directions on the back of the bottle. °6. Do not take aspirin or any products that contain aspirin such as Bufferin, Anacin,  Ecotrin, aspirin gum, Goodies, BC headache powders, etc., after a T&A because it can promote bleeding.  Please check with our office before administering any other medication that may been prescribed by other doctors during the two week post-operative period. °7. If you happen to look in the mirror or into your child’s mouth you will see white/gray patches on the back of the throat.  This is what a scab looks like in the mouth and is normal after having a T&A.  It will disappear once the tonsil area heals completely. However, it may cause a noticeable odor, and this too will disappear with time.     °8. You or your child may experience ear pain after having a T&A.  This is called referred pain and comes from the throat, but it is felt in the ears.  Ear pain is quite common and expected.  It will usually go away after ten days.  There is usually nothing wrong with the ears, and it is primarily due to the healing area stimulating the nerve to the ear that runs along the side of the throat.  Use either the prescribed pain medicine or Tylenol as needed.  °9. The throat tissues after a tonsillectomy are obviously sensitive.  Smoking around children who have had a tonsillectomy significantly increases the risk of bleeding.  DO NOT SMOKE!  ° °General Anesthesia, Adult, Care After °These instructions provide you with information about caring for yourself after your procedure. Your health care provider may also give you more specific instructions. Your treatment has been planned according to current medical practices, but problems sometimes occur. Call your health care provider if you have any problems or questions after your procedure. °What can I expect after the procedure? °After the procedure, it is common to have: °· Vomiting. °· A sore throat. °· Mental slowness. ° °It is common to feel: °· Nauseous. °· Cold or shivery. °· Sleepy. °· Tired. °· Sore or achy, even in parts of your body where you did not have  surgery. ° °Follow these instructions at home: °For at least 24 hours after the procedure: °· Do not: °? Participate in activities where you could fall or become injured. °? Drive. °? Use heavy machinery. °? Drink alcohol. °? Take sleeping pills or medicines that cause drowsiness. °? Make important decisions or sign legal documents. °? Take care of children on your own. °· Rest. °Eating and drinking °· If you vomit, drink water, juice, or soup when you can drink without vomiting. °· Drink enough fluid to keep your urine clear or pale yellow. °· Make sure you have little or no nausea before eating solid foods. °· Follow the diet recommended by your health care provider. °General instructions °· Have a responsible adult stay with you until you are awake and alert. °· Return to your normal activities as told by your health care provider. Ask your health care provider what activities are safe for you. °· Take over-the-counter and   prescription medicines only as told by your health care provider.  If you smoke, do not smoke without supervision.  Keep all follow-up visits as told by your health care provider. This is important. Contact a health care provider if:  You continue to have nausea or vomiting at home, and medicines are not helpful.  You cannot drink fluids or start eating again.  You cannot urinate after 8-12 hours.  You develop a skin rash.  You have fever.  You have increasing redness at the site of your procedure. Get help right away if:  You have difficulty breathing.  You have chest pain.  You have unexpected bleeding.  You feel that you are having a life-threatening or urgent problem. This information is not intended to replace advice given to you by your health care provider. Make sure you discuss any questions you have with your health care provider. Document Released: 12/04/2000 Document Revised: 01/31/2016 Document Reviewed: 08/12/2015 Elsevier Interactive Patient Education   2018 [employer]  Scopolamine skin patches REMOVE PATCH IN 72 HOURS AND WASH HANDS IMMEDIATELY  What is this medicine? SCOPOLAMINE (skoe POL a meen) is used to prevent nausea and vomiting caused by motion sickness, anesthesia and surgery. This medicine may be used for other purposes; ask your health care provider or pharmacist if you have questions. COMMON BRAND NAME(S): Transderm Scop What should I tell my health care provider before I take this medicine? They need to know if you have any of these conditions: -glaucoma -kidney or liver disease -an unusual or allergic reaction (especially skin allergy) to scopolamine, atropine, other medicines, foods, dyes, or preservatives -pregnant or trying to get pregnant -breast-feeding How should I use this medicine? This medicine is for external use only. Follow the directions on the prescription label. One patch contains enough medicine to prevent motion sickness for up to 3 days. Apply the patch at least 4 hours before you need it and only wear one disc at a time. Choose an area behind the ear, that is clean, dry, hairless and free from any cuts or irritation. Wipe the area with a clean dry tissue. Peel off the plastic backing of the skin patch, trying not to touch the adhesive side with your hands. Do not cut the patches. Firmly apply to the area you have chosen, with the metallic side of the patch to the skin and the tan-colored side showing. Once firmly in place, wash your hands well with soap and water. Remove the disc after 3 days, or sooner if you no longer need it. After removing the patch, wash your hands and the area behind your ear thoroughly with soap and water. The patch will still contain some medicine after use. To avoid accidental contact or ingestion by children or pets, fold the used patch in half with the sticky side together and throw away in the trash out of the reach of children and pets. If you need to use a second patch after you  remove the first, place it behind the other ear. Talk to your pediatrician regarding the use of this medicine in children. Special care may be needed. Overdosage: If you think you have taken too much of this medicine contact a poison control center or emergency room at once. NOTE: This medicine is only for you. Do not share this medicine with others. What if I miss a dose? Make sure you apply the patch at least 4 hours before you need it. You can apply it the night before traveling.  What may interact with this medicine? °-benztropine °-bethanechol °-medicines for anxiety or sleeping problems like diazepam or temazepam °-medicines for hay fever and other allergies °-medicines for mental depression °-muscle relaxants °This list may not describe all possible interactions. Give your health care provider a list of all the medicines, herbs, non-prescription drugs, or dietary supplements you use. Also tell them if you smoke, drink alcohol, or use illegal drugs. Some items may interact with your medicine. °What should I watch for while using this medicine? °Keep the patch dry, if possible, to prevent it from falling off. Limited contact with water, however, as in bathing or swimming, will not affect the system. If the patch falls off, throw it away and put a new one behind the other ear. °You may get drowsy or dizzy. Do not drive, use machinery, or do anything that needs mental alertness until you know how this medicine affects you. Do not stand or sit up quickly, especially if you are an older patient. This reduces the risk of dizzy or fainting spells. Alcohol may interfere with the effect of this medicine. Avoid alcoholic drinks. °Your mouth may get dry. Chewing sugarless gum or sucking hard candy, and drinking plenty of water may help. Contact your doctor if the problem does not go away or is severe. °This medicine may cause dry eyes and blurred vision. If you wear contact lenses you may feel some discomfort.  Lubricating drops may help. See your eye doctor if the problem does not go away or is severe. °If you are going to have a magnetic resonance imaging (MRI) procedure, tell your MRI technician if you have this patch on your body. It must be removed before a MRI. °What side effects may I notice from receiving this medicine? °Side effects that you should report to your doctor or health care professional as soon as possible: °-agitation, nervousness, confusion °-blurred vision and other eye problems °-dizziness, drowsiness °-eye pain or redness in the whites of the eye °-hallucinations °-pain or difficulty passing urine °-skin rash, itching °-vomiting °Side effects that usually do not require medical attention (report to your doctor or health care professional if they continue or are bothersome): °-headache °-nausea °This list may not describe all possible side effects. Call your doctor for medical advice about side effects. You may report side effects to FDA at 1-800-FDA-1088. °Where should I keep my medicine? °Keep out of the reach of children. °Store at room temperature between 20 and 25 degrees C (68 and 77 degrees F). Throw away any unused medicine after the expiration date. When you remove a patch, fold it and throw it in the trash as described above. °NOTE: This sheet is a summary. It may not cover all possible information. If you have questions about this medicine, talk to your doctor, pharmacist, or health care provider. °© 2018 Elsevier/Gold Standard (2012-01-25 13:31:48) ° ° °

## 2017-03-30 NOTE — H&P (Signed)
The patient's history has been reviewed, patient examined, no change in status, stable for surgery.  Questions were answered to the patients satisfaction.  

## 2017-03-30 NOTE — Transfer of Care (Signed)
Immediate Anesthesia Transfer of Care Note  Patient: Marcia Thomas  Procedure(s) Performed: Procedure(s): TONSILLECTOMY AND ADENOIDECTOMY (N/A)  Patient Location: PACU  Anesthesia Type: General  Level of Consciousness: awake, alert  and patient cooperative  Airway and Oxygen Therapy: Patient Spontanous Breathing and Patient connected to supplemental oxygen  Post-op Assessment: Post-op Vital signs reviewed, Patient's Cardiovascular Status Stable, Respiratory Function Stable, Patent Airway and No signs of Nausea or vomiting  Post-op Vital Signs: Reviewed and stable  Complications: No apparent anesthesia complications

## 2017-04-02 ENCOUNTER — Encounter: Payer: Self-pay | Admitting: Unknown Physician Specialty

## 2017-04-03 LAB — SURGICAL PATHOLOGY

## 2019-02-23 IMAGING — US US SOFT TISSUE HEAD/NECK
1 series · 14 of 25 positions shown · non-contrast
Comparison: None.

CLINICAL DATA: Left cervical lymphadenopathy x4 weeks

EXAM:
ULTRASOUND OF HEAD/NECK SOFT TISSUES
TECHNIQUE: Ultrasound examination of the head and neck soft tissues was
performed in the area of clinical concern.

[Series 1: us soft tissue head/neck · 0.05mm/px · 35 acquisitions, 14 frames shown]
[im 1/35]
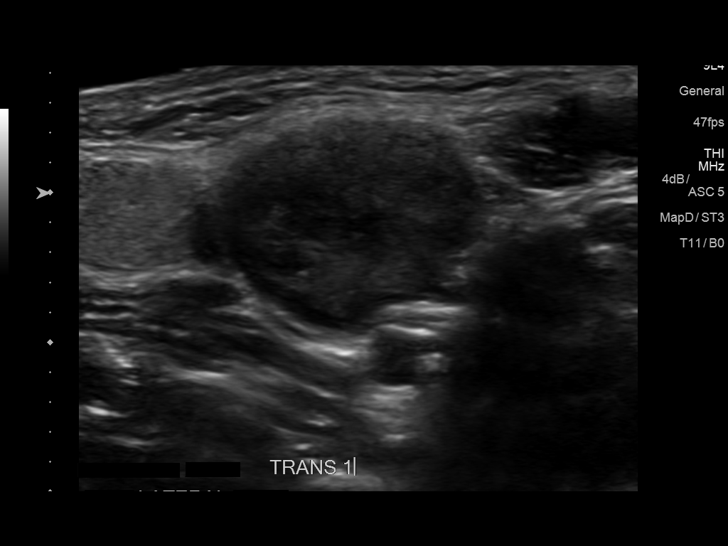
[im 3/35]
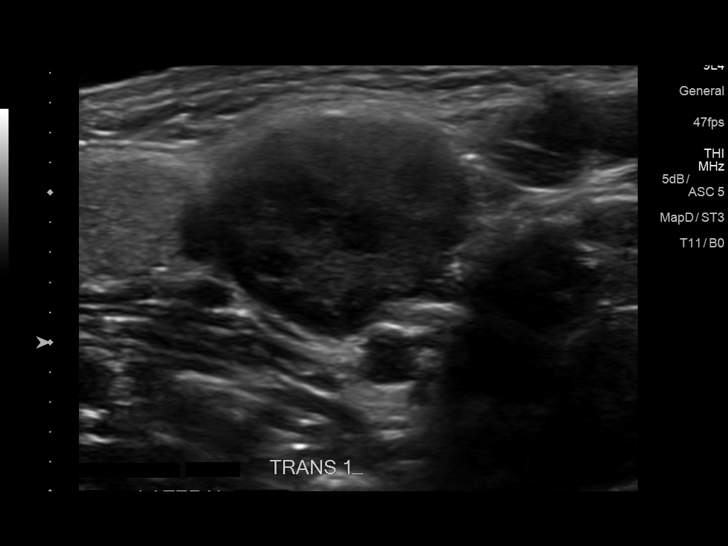
[im 6/35]
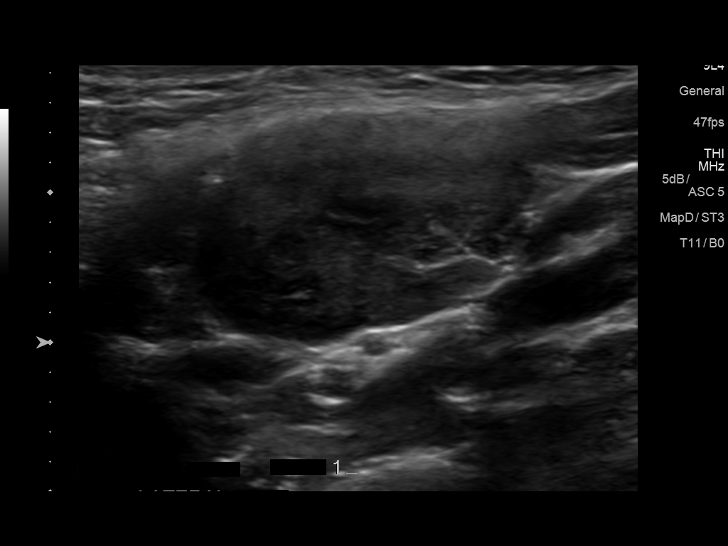
[im 9/35]
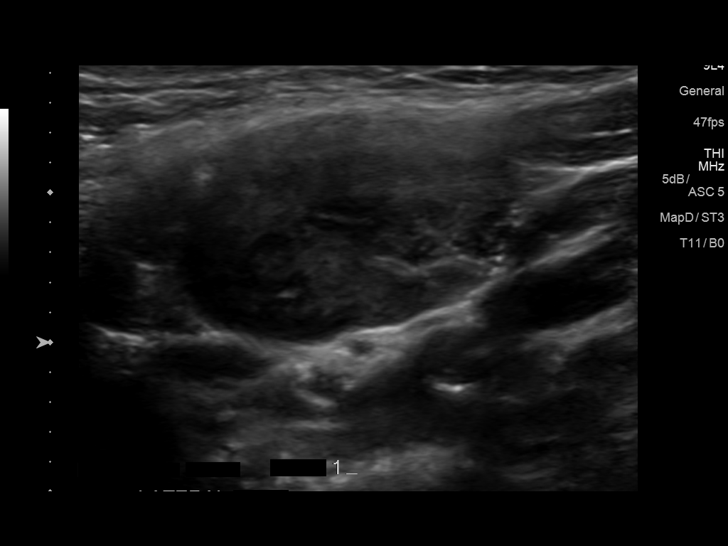
[im 12/35]
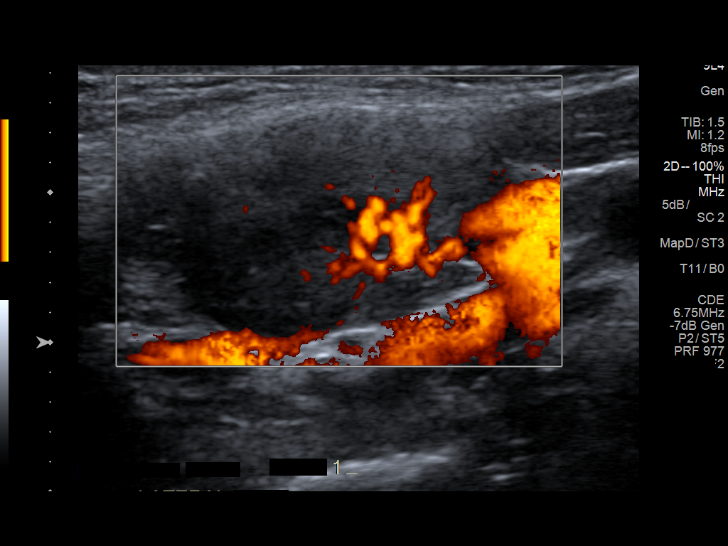
[im 13/35]
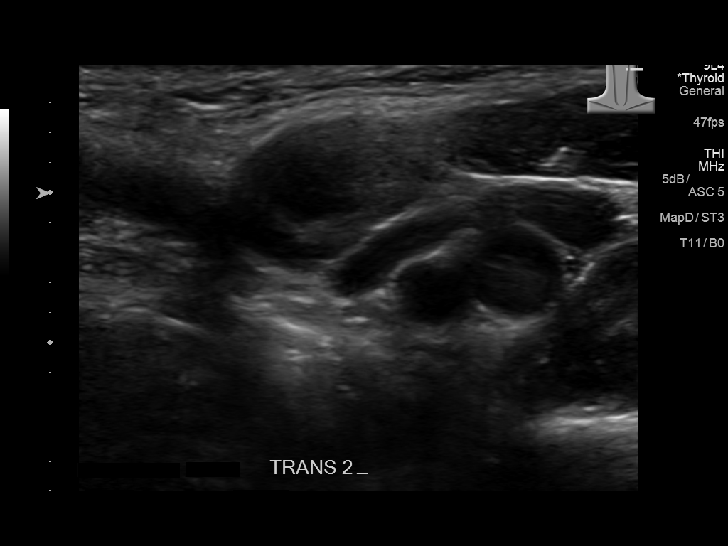
[im 16/35]
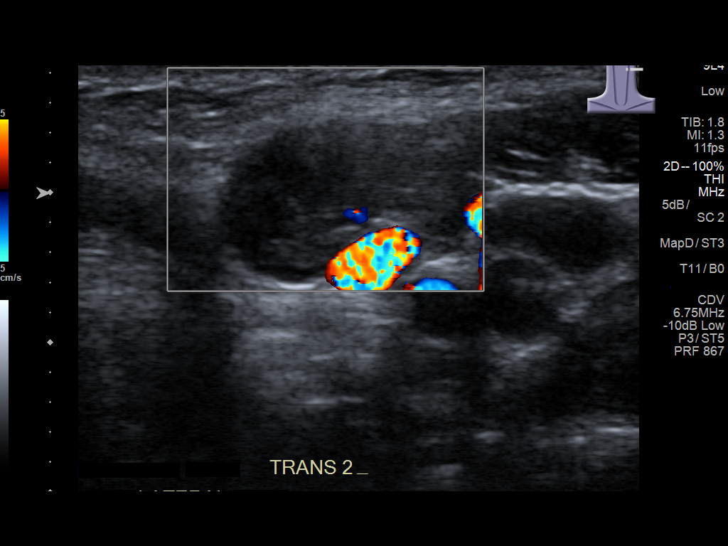
[im 19/35]
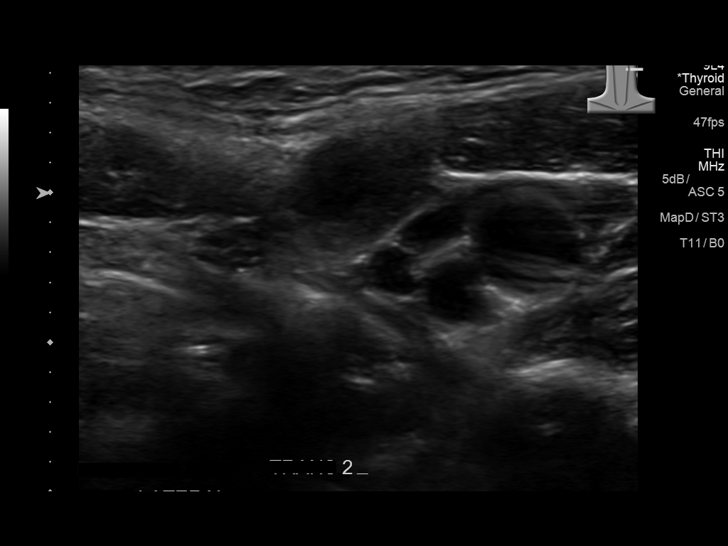
[im 22/35]
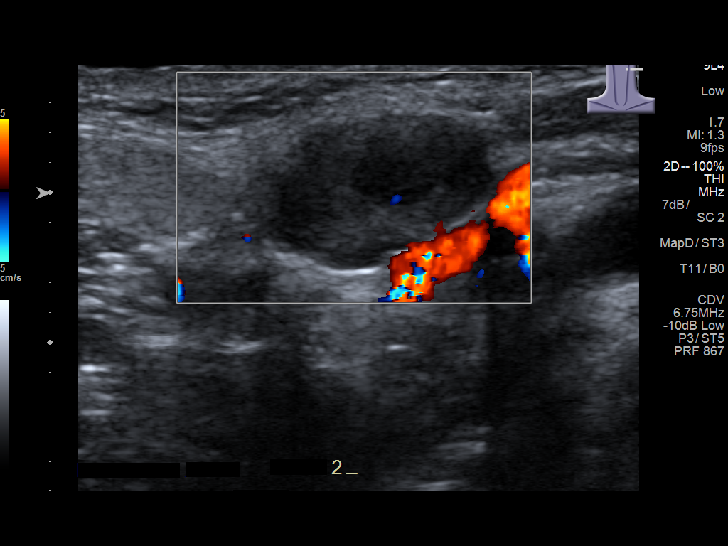
[im 23/35]
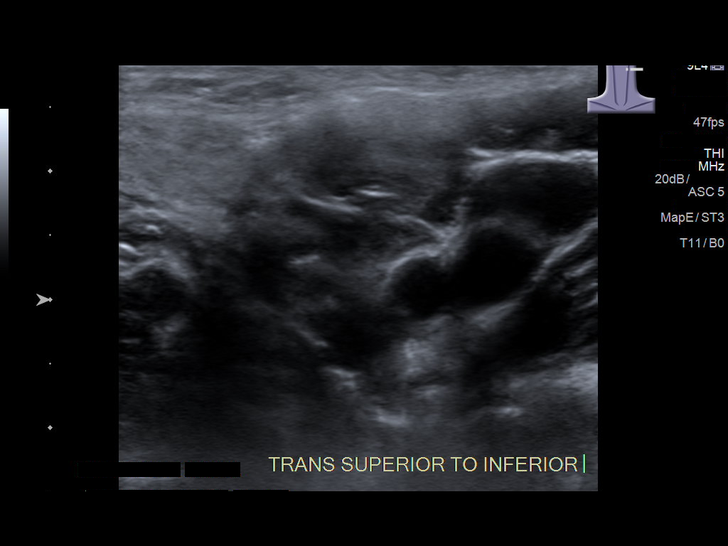
[im 26/35]
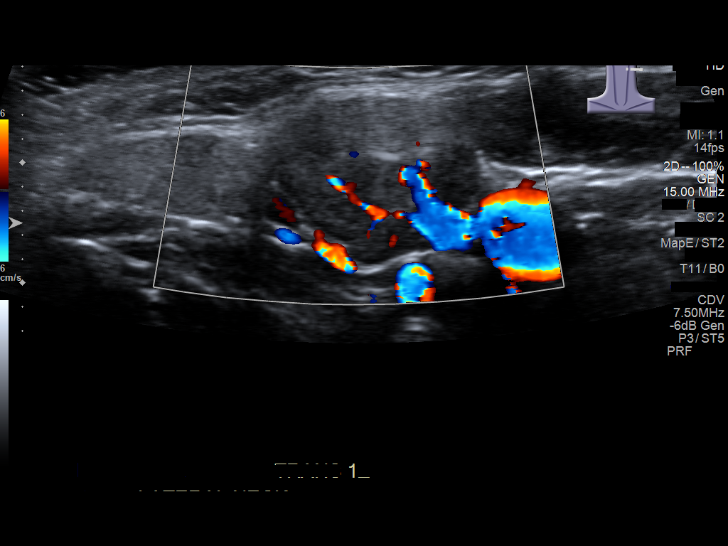
[im 29/35]
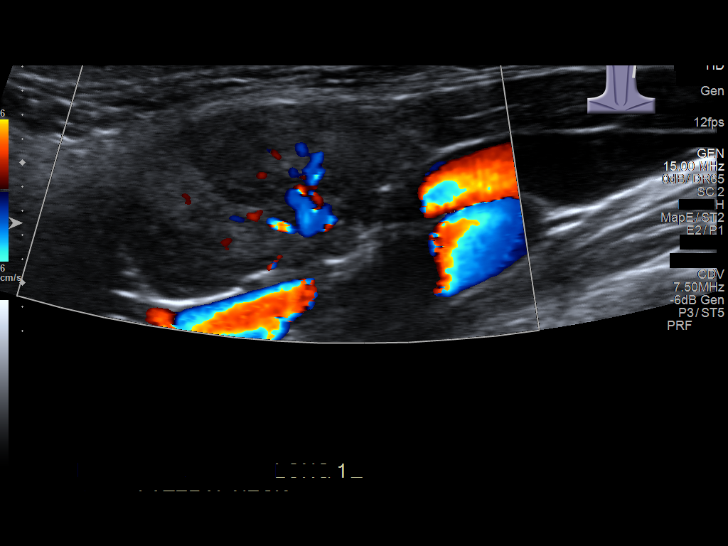
[im 32/35]
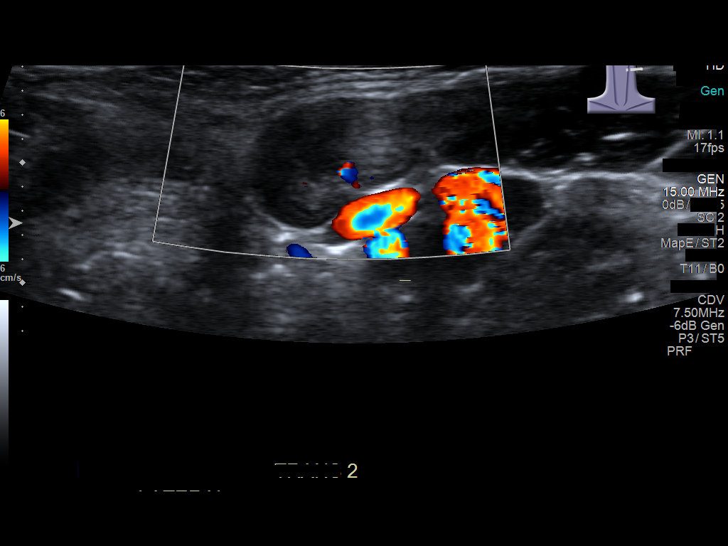
[im 35/35]
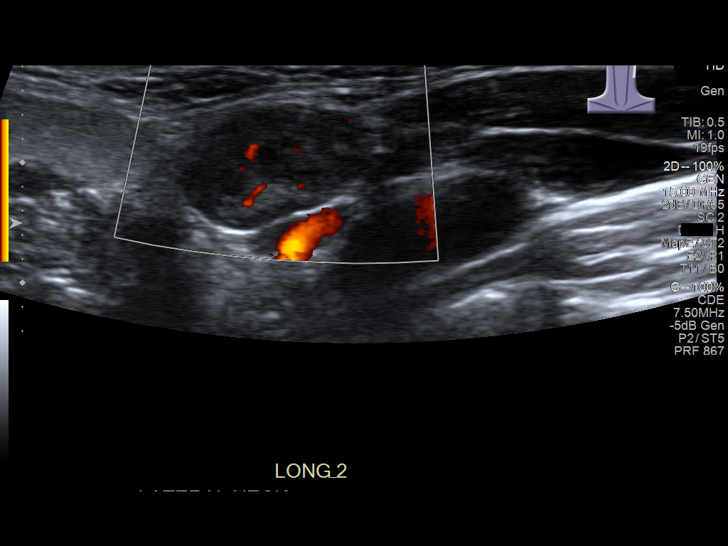

[14 of 25 positions shown; findings below may reference images not displayed]

FINDINGS: Enlarged 10-15 mm short axis diameter mildly hypoechoic left
cervical lymph nodes. The fatty hila are not well identified. There
is central normal vascular flow.
IMPRESSION: 1. Left cervical lymphadenopathy, possibly reactive but nonspecific.

## 2020-03-02 ENCOUNTER — Telehealth: Payer: Self-pay | Admitting: General Practice

## 2020-03-02 NOTE — Telephone Encounter (Signed)
Removed Dr. Dayton Martes as patient's PCP due to her leaving this office and patient has not been seen in 3 years.
